# Patient Record
Sex: Male | Born: 2000 | Race: Black or African American | Hispanic: No | Marital: Single | Smoking: Never smoker
Health system: Southern US, Community
[De-identification: ages and names within clinical notes are randomized; demographics above are authoritative.]

## PROBLEM LIST (undated history)

## (undated) DIAGNOSIS — R011 Cardiac murmur, unspecified: Secondary | ICD-10-CM

## (undated) DIAGNOSIS — F909 Attention-deficit hyperactivity disorder, unspecified type: Secondary | ICD-10-CM

## (undated) DIAGNOSIS — M2340 Loose body in knee, unspecified knee: Secondary | ICD-10-CM

## (undated) DIAGNOSIS — J45909 Unspecified asthma, uncomplicated: Secondary | ICD-10-CM

## (undated) DIAGNOSIS — M958 Other specified acquired deformities of musculoskeletal system: Secondary | ICD-10-CM

---
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
## 2019-02-18 ENCOUNTER — Emergency Department (HOSPITAL_COMMUNITY): Payer: Medicaid Other

## 2019-02-18 ENCOUNTER — Emergency Department (HOSPITAL_COMMUNITY)
Admission: EM | Admit: 2019-02-18 | Discharge: 2019-02-18 | Disposition: A | Discharge status: Home / Self Care | Payer: Medicaid Other | Attending: Emergency Medicine | Admitting: Emergency Medicine

## 2019-02-18 ENCOUNTER — Encounter (HOSPITAL_COMMUNITY): Payer: Self-pay | Admitting: Emergency Medicine

## 2019-02-18 ENCOUNTER — Other Ambulatory Visit: Payer: Self-pay

## 2019-02-18 DIAGNOSIS — Y92310 Basketball court as the place of occurrence of the external cause: Secondary | ICD-10-CM | POA: Insufficient documentation

## 2019-02-18 DIAGNOSIS — Y9367 Activity, basketball: Secondary | ICD-10-CM | POA: Insufficient documentation

## 2019-02-18 DIAGNOSIS — M7918 Myalgia, other site: Secondary | ICD-10-CM

## 2019-02-18 DIAGNOSIS — Y999 Unspecified external cause status: Secondary | ICD-10-CM | POA: Insufficient documentation

## 2019-02-18 DIAGNOSIS — W51XXXA Accidental striking against or bumped into by another person, initial encounter: Secondary | ICD-10-CM | POA: Insufficient documentation

## 2019-02-18 DIAGNOSIS — J45909 Unspecified asthma, uncomplicated: Secondary | ICD-10-CM | POA: Insufficient documentation

## 2019-02-18 DIAGNOSIS — M791 Myalgia, unspecified site: Secondary | ICD-10-CM | POA: Diagnosis not present

## 2019-02-18 DIAGNOSIS — R0789 Other chest pain: Secondary | ICD-10-CM | POA: Diagnosis present

## 2019-02-18 NOTE — ED Provider Notes (Signed)
[facility] Provider Note   CSN: [id number] Arrival date & time: 02/18/19  1339    History   Chief Complaint Chief Complaint  Patient presents with  . Clavicle Injury    HPI Jones BroomJeremiah E Loy is a 18 y.o. male presenting for evaluation of left sternoclavicular and left-sided chest pain.  Patient states yesterday he was playing basketball when he was hit left side chest.  He states he heard something pop.  He was having some left-sided chest wall pain yesterday.  When he woke up this morning, he was having pain more at his sternoclavicular joint.  He has taken Tylenol without improvement of symptoms.  He denies hitting his head or loss of consciousness during the injury.  He denies numbness or tingling of the left arm.  He states pain is worse with movement and palpation, nothing makes it better.  No increased pain with deep breaths.  He has no medical problems, takes medications daily.     HPI  Past Medical History:  Diagnosis Date  . ADHD (attention deficit hyperactivity disorder)   . Asthma    no inhaler use in > 1 yr.  Marland Kitchen. Heart murmur    "functional", per Dr. Yevonne PaxGregory Tatum, [facility]  . Loose body in knee 08/2014   left  . 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 Left knee pain 06/09/2014  . Effusion of left knee 06/09/2014  . Patellar subluxation 06/09/2014  . Feeling faint 06/18/2012  . Cardiac murmur 06/18/2012  . Abnormal ECG 06/18/2012  . APC (atrial premature contractions) 06/18/2012    Past Surgical History:  Procedure Laterality Date  . KNEE ARTHROSCOPY Left 09/12/2014   Procedure: ARTHROSCOPY KNEE with loose body removal;  Surgeon: Velna OchsPeter G Dalldorf, MD;  Location: [facility];  Service: Orthopedics;  Laterality: Left;        Home Medications    Prior to Admission medications   Medication Sig Start Date End Date  Taking? Authorizing Provider  acetaminophen-codeine (TYLENOL #3) 300-30 MG per tablet Take 1-2 tablets by mouth every 6 (six) hours as needed for moderate pain. 09/12/14   Elodia FlorenceNida, Andrew, PA-C  albuterol (PROVENTIL HFA;VENTOLIN HFA) 108 (90 BASE) MCG/ACT inhaler Inhale 2 puffs into the lungs every 6 (six) hours as needed. For shortness of breath/wheezing    [provider]  guanFACINE (INTUNIV) 2 MG TB24 Take 2 mg by mouth every evening.    [provider]  lisdexamfetamine (VYVANSE) 50 MG capsule Take 50 mg by mouth every morning.    [provider]  metoCLOPramide (REGLAN) 10 MG tablet Take 1 tablet (10 mg total) by mouth every 8 (eight) hours as needed for nausea (nausea/headache). 02/02/18   Palumbo, April, MD    Family History Family History  Problem Relation Age of Onset  . Hypertension Father     Social History Social History   Tobacco Use  . Smoking status: Never Smoker  . Smokeless tobacco: Never Used  Substance Use Topics  . Alcohol use: No  . Drug use: No     Allergies   Patient has no known allergies.   Review of Systems Review of Systems  Musculoskeletal: Positive for arthralgias and myalgias.  Neurological: Negative for numbness.  Hematological: Does not bruise/bleed easily.     Physical Exam Updated Vital Signs BP (!) 145/82   Pulse 79   Temp  98.2 F (36.8 C) (Oral)   Resp 12   Ht 6\' 3"  (1.905 m)   Wt 93 kg   SpO2 98%   BMI 25.62 kg/m   Physical Exam Vitals signs and nursing note reviewed.  Constitutional:      General: He is not in acute distress.    Appearance: He is well-developed.     Comments: Appears nontoxic  HENT:     Head: Normocephalic and atraumatic.  Neck:     Musculoskeletal: Normal range of motion.  Cardiovascular:     Rate and Rhythm: Normal rate and regular rhythm.     Pulses: Normal pulses.  Pulmonary:     Effort: Pulmonary effort is normal.     Breath sounds: Normal breath sounds.     Comments:  Speaking in full sentences.  Clear lung sounds in all fields.  No pain with inspiration.  No obvious deformity to chest wall.  No tenderness palpation of the chest wall. Abdominal:     General: There is no distension.  Musculoskeletal: Normal range of motion.     Comments: Tenderness palpation at the sternoclavicular joint.  No significant tenderness palpation of the distal clavicle.  No tenderness palpation of the shoulder joint.  No obvious deformities or tenting.  Radial pulses intact bilaterally.  Sensation of upper extremities intact bilaterally.  Grip strength intact bilaterally.  Skin:    General: Skin is warm.     Capillary Refill: Capillary refill takes less than 2 seconds.     Findings: No rash.  Neurological:     Mental Status: He is alert and oriented to person, place, and time.      ED Treatments / Results  Labs (all labs ordered are listed, but only abnormal results are displayed) Labs Reviewed - No data to display  EKG None  Radiology Dg Ribs Unilateral W/chest Left  Result Date: 02/18/2019 CLINICAL DATA:  Left rib pain after injury yesterday. EXAM: LEFT RIBS AND CHEST - 3+ VIEW COMPARISON:  Radiographs of Jan 15, 2005. FINDINGS: No fracture or other bone lesions are seen involving the ribs. There is no evidence of pneumothorax or pleural effusion. Both lungs are clear. Heart size and mediastinal contours are within normal limits. IMPRESSION: Negative. Electronically Signed   By: Lupita RaiderJames  Green Jr M.D.   On: 02/18/2019 14:52   Dg Clavicle Left  Result Date: 02/18/2019 CLINICAL DATA:  Collided with another player while playing basketball yesterday, proximal LEFT clavicular pain, anterior LEFT rib pain EXAM: LEFT CLAVICLE - 2+ VIEWS COMPARISON:  None FINDINGS: Osseous mineralization normal. AC joint alignment normal. No fracture or bone destruction. Visualized LEFT ribs unremarkable. IMPRESSION: No acute abnormalities. Electronically Signed   By: Ulyses SouthwardMark  Boles M.D.   On:  02/18/2019 14:50    Procedures Procedures (including critical care time)  Medications Ordered in ED Medications - No data to display   Initial Impression / Assessment and Plan / ED Course  I have reviewed the triage vital signs and the nursing notes.  Pertinent labs & imaging results that were available during my care of the patient were reviewed by me and considered in my medical decision making (see chart for details).        Pt presenting for evaluation of left chest wall and clavicular pain after an injury yesterday.  Physical examination, he appears nontoxic.  No obvious deformities.  Will treat x-rays of the clavicle and left-sided ribs for further evaluation.  X-rays viewed and interpreted by me, no fracture or  dislocation.  Discussed findings with patient.  Discussed likely muscular pain and symptomatic treatment with NSAIDs, Tylenol, muscle creams.  At this time, patient appears safe for discharge.  Return precautions given.  Patient states he understands and agrees to plan.   Final Clinical Impressions(s) / ED Diagnoses   Final diagnoses:  Musculoskeletal pain    ED Discharge Orders    None       Franchot Heidelberg, PA-C 02/18/19 1809    Margette Fast, MD 02/18/19 1914

## 2019-02-18 NOTE — ED Triage Notes (Signed)
Pt arrives via POV from home with left shoulder pain since yesterday when playing basketball hit by another large player hard and felt a pop. Pt awake, alert, appropriate. VSS.

## 2019-02-18 NOTE — ED Notes (Signed)
Discharge instructions discussed with Pt. Pt verbalized understanding. Pt stable and ambulatory.    

## 2019-02-18 NOTE — Discharge Instructions (Signed)
Take ibuprofen 3 times a day with meals.  Do not take other anti-inflammatories at the same time (Advil, Motrin, naproxen, Aleve). You may supplement with Tylenol if you need further pain control. Use ice packs or heating pads if this helps control your pain. Use muscle creams (salonpas, icy hot, bengay) as needed for pain control.  Follow-up with your primary care doctor in 1 week if symptoms not improving. Return to the emergency room if you develop severe worsening pain, difficulty breathing, numbness, inability to move your arm, or any new, worsening, or concerning symptoms.

## 2019-07-08 IMAGING — CR LEFT CLAVICLE - 2+ VIEWS
2 series · 2 of 2 positions shown · non-contrast
Comparison: None

CLINICAL DATA: Collided with another player while playing
basketball yesterday, proximal LEFT clavicular pain, anterior LEFT
rib pain

EXAM:
LEFT CLAVICLE - 2+ VIEWS

[clavicle ap]
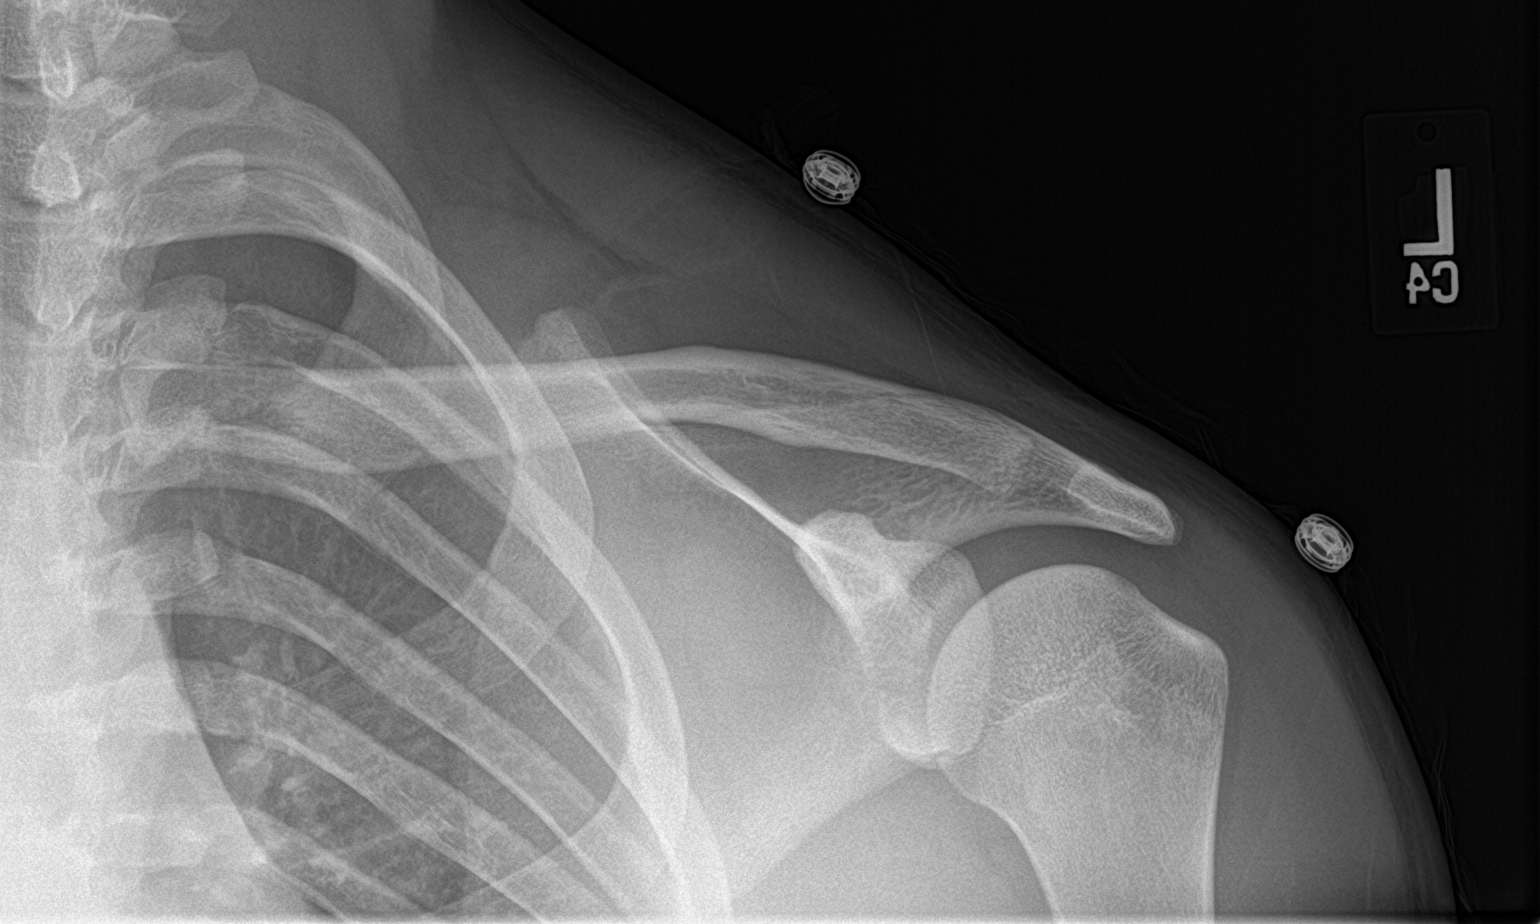

[clavicle axial]
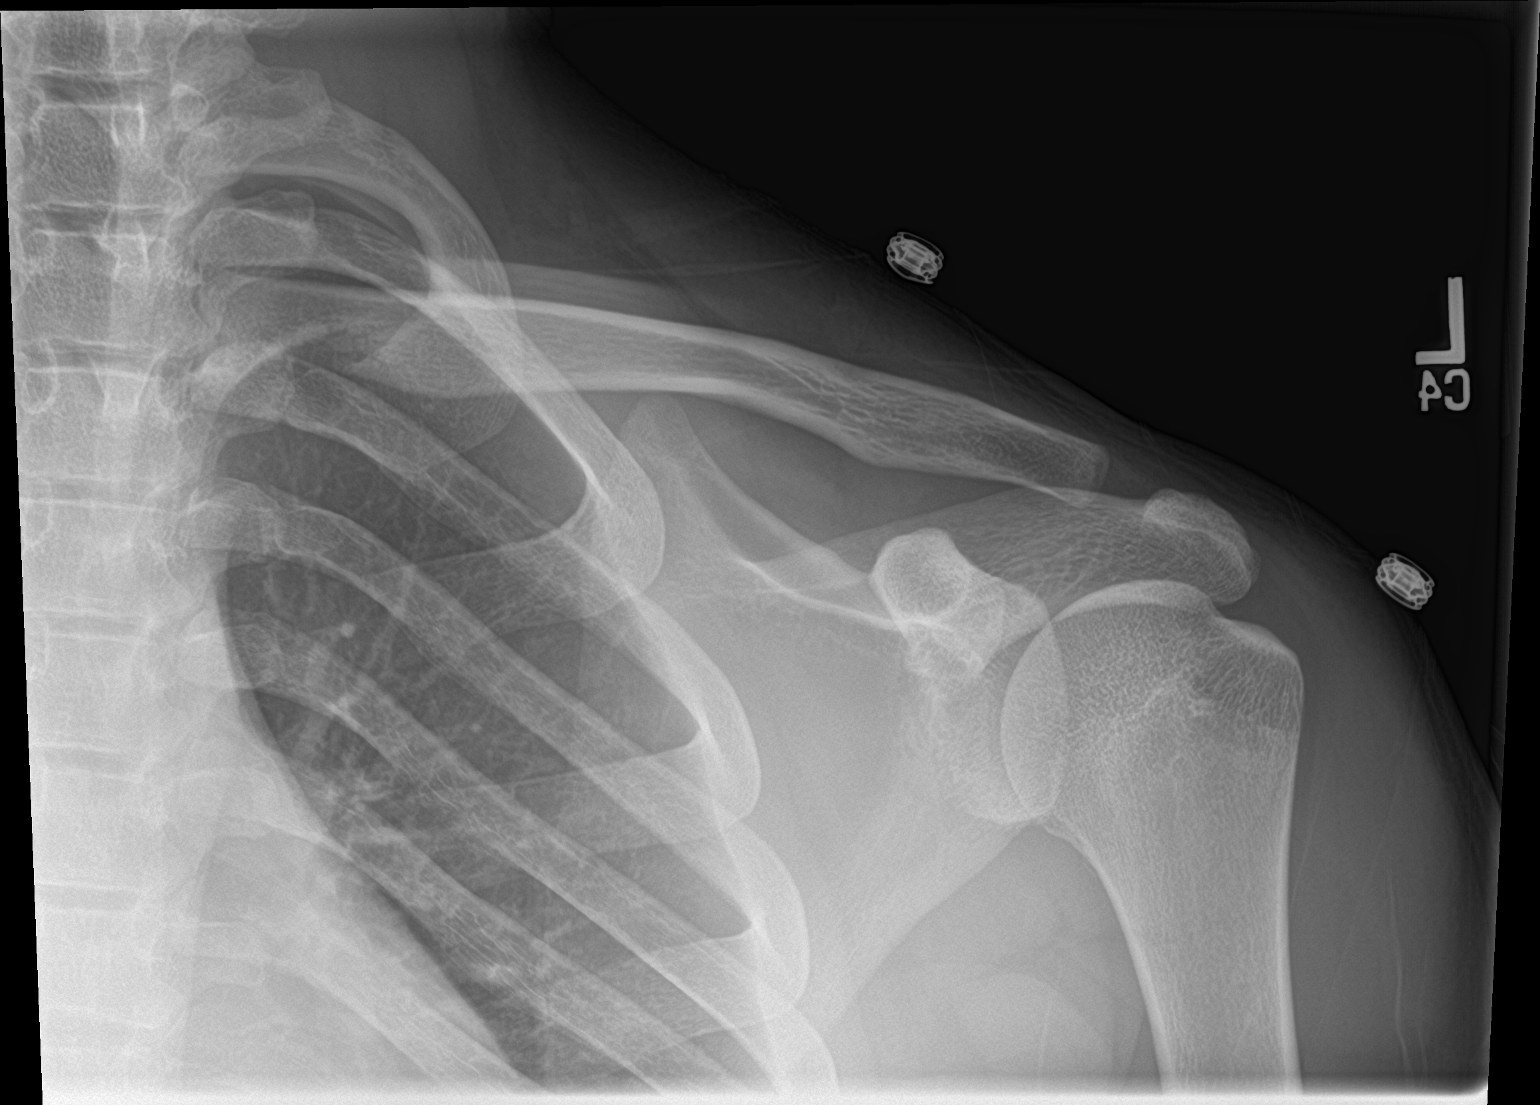

[2 of 2 positions shown; findings below may reference images not displayed]

FINDINGS: Osseous mineralization normal.

AC joint alignment normal.

No fracture or bone destruction.

Visualized LEFT ribs unremarkable.
IMPRESSION: No acute abnormalities.
